# Patient Record
Sex: Female | Born: 1985 | Race: White | Hispanic: Yes | Marital: Married
Health system: Southern US, Community
[De-identification: ages and names within clinical notes are randomized; demographics above are authoritative.]

## PROBLEM LIST (undated history)

## (undated) DIAGNOSIS — N200 Calculus of kidney: Secondary | ICD-10-CM

---

## 2014-01-23 ENCOUNTER — Emergency Department (HOSPITAL_COMMUNITY)
Admission: EM | Admit: 2014-01-23 | Discharge: 2014-01-23 | Disposition: A | Discharge status: Home / Self Care | Payer: Self-pay | Attending: Emergency Medicine | Admitting: Emergency Medicine

## 2014-01-23 ENCOUNTER — Emergency Department (HOSPITAL_COMMUNITY): Payer: Self-pay

## 2014-01-23 ENCOUNTER — Encounter (HOSPITAL_COMMUNITY): Payer: Self-pay | Admitting: Emergency Medicine

## 2014-01-23 DIAGNOSIS — Z3202 Encounter for pregnancy test, result negative: Secondary | ICD-10-CM | POA: Insufficient documentation

## 2014-01-23 DIAGNOSIS — R197 Diarrhea, unspecified: Secondary | ICD-10-CM | POA: Insufficient documentation

## 2014-01-23 DIAGNOSIS — Z87442 Personal history of urinary calculi: Secondary | ICD-10-CM | POA: Insufficient documentation

## 2014-01-23 DIAGNOSIS — R109 Unspecified abdominal pain: Secondary | ICD-10-CM

## 2014-01-23 DIAGNOSIS — R509 Fever, unspecified: Secondary | ICD-10-CM | POA: Insufficient documentation

## 2014-01-23 DIAGNOSIS — R1032 Left lower quadrant pain: Secondary | ICD-10-CM | POA: Insufficient documentation

## 2014-01-23 DIAGNOSIS — R110 Nausea: Secondary | ICD-10-CM | POA: Insufficient documentation

## 2014-01-23 DIAGNOSIS — R55 Syncope and collapse: Secondary | ICD-10-CM | POA: Insufficient documentation

## 2014-01-23 DIAGNOSIS — R1012 Left upper quadrant pain: Secondary | ICD-10-CM | POA: Insufficient documentation

## 2014-01-23 HISTORY — DX: Calculus of kidney: N20.0

## 2014-01-23 LAB — COMPREHENSIVE METABOLIC PANEL
ALT: 55 U/L — ABNORMAL HIGH (ref 0–35)
AST: 54 U/L — ABNORMAL HIGH (ref 0–37)
Albumin: 4.1 g/dL (ref 3.5–5.2)
Alkaline Phosphatase: 76 U/L (ref 39–117)
BUN: 10 mg/dL (ref 6–23)
CALCIUM: 9.3 mg/dL (ref 8.4–10.5)
CO2: 24 mEq/L (ref 19–32)
CREATININE: 0.75 mg/dL (ref 0.50–1.10)
Chloride: 101 mEq/L (ref 96–112)
GFR calc Af Amer: >90 mL/min (ref >90)
GFR calc non Af Amer: >90 mL/min (ref >90)
Glucose, Bld: 107 mg/dL — ABNORMAL HIGH (ref 70–99)
Potassium: 3.9 mEq/L (ref 3.7–5.3)
SODIUM: 138 meq/L (ref 137–147)
TOTAL PROTEIN: 8.2 g/dL (ref 6.0–8.3)
Total Bilirubin: 0.2 mg/dL — ABNORMAL LOW (ref 0.3–1.2)

## 2014-01-23 LAB — CBC WITH DIFFERENTIAL/PLATELET
BASOS ABS: 0 10*3/uL (ref 0.0–0.1)
BASOS PCT: 0 % (ref 0–1)
EOS ABS: 0 10*3/uL (ref 0.0–0.7)
Eosinophils Relative: 0 % (ref 0–5)
HEMATOCRIT: 36.4 % (ref 36.0–46.0)
Hemoglobin: 12.4 g/dL (ref 12.0–15.0)
Lymphocytes Relative: 8 % — ABNORMAL LOW (ref 12–46)
Lymphs Abs: 0.8 10*3/uL (ref 0.7–4.0)
MCH: 30 pg (ref 26.0–34.0)
MCHC: 34.1 g/dL (ref 30.0–36.0)
MCV: 87.9 fL (ref 78.0–100.0)
MONO ABS: 0.5 10*3/uL (ref 0.1–1.0)
Monocytes Relative: 6 % (ref 3–12)
Neutro Abs: 7.9 10*3/uL — ABNORMAL HIGH (ref 1.7–7.7)
Neutrophils Relative %: 86 % — ABNORMAL HIGH (ref 43–77)
PLATELETS: 200 10*3/uL (ref 150–400)
RBC: 4.14 MIL/uL (ref 3.87–5.11)
RDW: 14.6 % (ref 11.5–15.5)
WBC: 9.2 10*3/uL (ref 4.0–10.5)

## 2014-01-23 LAB — URINALYSIS, ROUTINE W REFLEX MICROSCOPIC
Bilirubin Urine: NEGATIVE
Glucose, UA: NEGATIVE mg/dL
Hgb urine dipstick: NEGATIVE
KETONES UR: NEGATIVE mg/dL
LEUKOCYTES UA: NEGATIVE
NITRITE: NEGATIVE
PH: 7 (ref 5.0–8.0)
Protein, ur: NEGATIVE mg/dL
Specific Gravity, Urine: 1.02 (ref 1.005–1.030)
Urobilinogen, UA: 1 mg/dL (ref 0.0–1.0)

## 2014-01-23 LAB — WET PREP, GENITAL
Clue Cells Wet Prep HPF POC: NONE SEEN
Trich, Wet Prep: NONE SEEN
YEAST WET PREP: NONE SEEN

## 2014-01-23 LAB — LIPASE, BLOOD: Lipase: 26 U/L (ref 11–59)

## 2014-01-23 LAB — PREGNANCY, URINE: Preg Test, Ur: NEGATIVE

## 2014-01-23 MED ORDER — ONDANSETRON HCL 4 MG PO TABS: 4.0000 mg | ORAL_TABLET | Freq: Four times a day (QID) | ORAL | Status: AC

## 2014-01-23 MED ORDER — IOHEXOL 300 MG/ML  SOLN
100.0000 mL | Freq: Once | INTRAMUSCULAR | Status: AC | PRN
Start: 2014-01-23 — End: 2014-01-23
  Administered 2014-01-23: 100 mL via INTRAVENOUS

## 2014-01-23 MED ORDER — SODIUM CHLORIDE 0.9 % IV BOLUS (SEPSIS)
1000.0000 mL | Freq: Once | INTRAVENOUS | Status: AC
  Administered 2014-01-23: 1000 mL via INTRAVENOUS

## 2014-01-23 MED ORDER — MORPHINE SULFATE 4 MG/ML IJ SOLN
4.0000 mg | Freq: Once | INTRAMUSCULAR | Status: AC
  Administered 2014-01-23: 4 mg via INTRAVENOUS
  Filled 2014-01-23: qty 1

## 2014-01-23 MED ORDER — IOHEXOL 300 MG/ML  SOLN
50.0000 mL | Freq: Once | INTRAMUSCULAR | Status: AC | PRN
  Administered 2014-01-23: 50 mL via ORAL

## 2014-01-23 MED ORDER — OXYCODONE-ACETAMINOPHEN 5-325 MG PO TABS: 1.0000 | ORAL_TABLET | Freq: Four times a day (QID) | ORAL | Status: AC | PRN

## 2014-01-23 MED ORDER — ONDANSETRON HCL 4 MG/2ML IJ SOLN
4.0000 mg | Freq: Once | INTRAMUSCULAR | Status: AC
  Administered 2014-01-23: 4 mg via INTRAVENOUS
  Filled 2014-01-23: qty 2

## 2014-01-23 NOTE — ED Provider Notes (Signed)
CSN: [id number]     Arrival date & time 01/23/14  0741 History   First MD Initiated Contact with Patient 01/23/14 0801     Chief Complaint  Patient presents with  . Loss of Consciousness  . Headache     (Consider location/radiation/quality/duration/timing/severity/associated sxs/prior Treatment) HPI  Spanish language intepretor used for HPI   Patient presents to the ER with multiple complaints of tactile fever, left flank pain, nausea, and syncope.  She reports that she developed diarrhea with headache for a week that has since resolved. Last night she developed severe nausea and dry heaving with pain last night. This morning when she woke up she had left flank pain that radiates into her back. It is constant and described as a cramping "contractions" pain. She has not had any episodes of vomiting, no RLQ or RUQ abdominal pain. This morning while making breakfast for her husband she reports the feels dizzy and nauseous, while her flank was hurting and passing out. She feels it was very brief and next only remembers her husband talking on the phone. She denies travel outside of the country within the past 6 months and denies being around sick contacts.  At this point she is well looking, awake, alert and unaltered. Currently, no headache or neck pain.   Past Medical History  Diagnosis Date  . Kidney stone    No past surgical history on file. No family history on file. History  Substance Use Topics  . Smoking status: Not on file  . Smokeless tobacco: Not on file  . Alcohol Use: Not on file   OB History   Grav Para Term Preterm Abortions TAB SAB Ect Mult Living                 Review of Systems   Review of Systems  Gen: no weight loss,chills, night sweats  +fevers Eyes: no discharge or drainage, no occular pain or visual changes  Nose: no epistaxis or rhinorrhea  Mouth: no dental pain, no sore throat  Neck: no neck pain  Lungs:No wheezing, coughing or hemoptysis CV: no  chest pain, palpitations, dependent edema or orthopnea  Abd: no abdominal pain,  Vomiting,  + diarrhea and nausea GU: no dysuria or gross hematuria  MSK:  No muscle weakness or pain Neuro: no headache, no focal neurologic deficits, + syncope  Skin: no rash or wounds Psyche: no complaints     Allergies  Review of patient's allergies indicates no known allergies.  Home Medications   Prior to Admission medications   Medication Sig Start Date End Date Taking? Authorizing Provider  Phenylephrine HCl (CONTAC-D PO) Take 1 tablet by mouth as needed (cold sysptoms).   Yes Historical Provider, MD   BP 103/62  Pulse 90  Temp(Src) 99.3 F (37.4 C) (Oral)  Resp 18  SpO2 99% Physical Exam  Nursing note and vitals reviewed. Constitutional: She is oriented to person, place, and time. She appears well-developed and well-nourished. No distress.  HENT:  Head: Normocephalic and atraumatic.  Eyes: Pupils are equal, round, and reactive to light.  Neck: Normal range of motion. Neck supple.  Cardiovascular: Normal rate and regular rhythm.   Pulmonary/Chest: Effort normal.  Abdominal: Soft. Bowel sounds are normal. She exhibits no distension and no fluid wave. There is tenderness in the left upper quadrant and left lower quadrant. There is CVA tenderness (left).    Neurological: She is alert and oriented to person, place, and time. She has normal strength. No cranial nerve  deficit or sensory deficit. GCS eye subscore is 4. GCS verbal subscore is 5. GCS motor subscore is 6.  Skin: Skin is warm and dry.      ED Course  Procedures (including critical care time) Labs Review Labs Reviewed  WET PREP, GENITAL - Abnormal; Notable for the following:    WBC, Wet Prep HPF POC FEW (*)    All other components within normal limits  CBC WITH DIFFERENTIAL - Abnormal; Notable for the following:    Neutrophils Relative % 86 (*)    Neutro Abs 7.9 (*)    Lymphocytes Relative 8 (*)    All other components  within normal limits  COMPREHENSIVE METABOLIC PANEL - Abnormal; Notable for the following:    Glucose, Bld 107 (*)    AST 54 (*)    ALT 55 (*)    Total Bilirubin 0.2 (*)    All other components within normal limits  GC/CHLAMYDIA PROBE AMP  LIPASE, BLOOD  PREGNANCY, URINE  URINALYSIS, ROUTINE W REFLEX MICROSCOPIC    Imaging Review [place] Transvaginal Non-ob  01/23/2014   CLINICAL DATA:  Rule out ovarian torsion  EXAM: TRANSABDOMINAL AND TRANSVAGINAL ULTRASOUND OF PELVIS  DOPPLER ULTRASOUND OF OVARIES  TECHNIQUE: Both transabdominal and transvaginal ultrasound examinations of the pelvis were performed. Transabdominal technique was performed for global imaging of the pelvis including uterus, ovaries, adnexal regions, and pelvic cul-de-sac.  It was necessary to proceed with endovaginal exam following the transabdominal exam to visualize the uterus, endometrium, ovaries and adnexa . Color and duplex Doppler ultrasound was utilized to evaluate blood flow to the ovaries.  COMPARISON:  None.  FINDINGS: Uterus  Measurements: 11.5 x 3.7 x 5.4 cm. No fibroids or other mass visualized.  Endometrium  Thickness: 7 mm.  No focal abnormality visualized.  Right ovary  Measurements: 2.7 x 1.9 x 2.0 cm. Normal appearance/no adnexal mass.  Left ovary  Measurements: 3.0 x 1.5 x 2.4 cm. Normal appearance/no adnexal mass.  Pulsed Doppler evaluation of both ovaries demonstrates normal low-resistance arterial and venous waveforms.  Other findings  Trace free fluid in the pelvis  IMPRESSION: Unremarkable study.   Electronically Signed   By: Charlett NoseKevin  Dover M.D.   On: 01/23/2014 12:29   [place] Pelvis Complete  01/23/2014   CLINICAL DATA:  Rule out ovarian torsion  EXAM: TRANSABDOMINAL AND TRANSVAGINAL ULTRASOUND OF PELVIS  DOPPLER ULTRASOUND OF OVARIES  TECHNIQUE: Both transabdominal and transvaginal ultrasound examinations of the pelvis were performed. Transabdominal technique was performed for global imaging of the pelvis including  uterus, ovaries, adnexal regions, and pelvic cul-de-sac.  It was necessary to proceed with endovaginal exam following the transabdominal exam to visualize the uterus, endometrium, ovaries and adnexa . Color and duplex Doppler ultrasound was utilized to evaluate blood flow to the ovaries.  COMPARISON:  None.  FINDINGS: Uterus  Measurements: 11.5 x 3.7 x 5.4 cm. No fibroids or other mass visualized.  Endometrium  Thickness: 7 mm.  No focal abnormality visualized.  Right ovary  Measurements: 2.7 x 1.9 x 2.0 cm. Normal appearance/no adnexal mass.  Left ovary  Measurements: 3.0 x 1.5 x 2.4 cm. Normal appearance/no adnexal mass.  Pulsed Doppler evaluation of both ovaries demonstrates normal low-resistance arterial and venous waveforms.  Other findings  Trace free fluid in the pelvis  IMPRESSION: Unremarkable study.   Electronically Signed   By: Charlett NoseKevin  Dover M.D.   On: 01/23/2014 12:29   Ct Abdomen Pelvis W Contrast  01/23/2014   CLINICAL DATA:  Left flank pain,  left lower quadrant pain  EXAM: CT ABDOMEN AND PELVIS WITH CONTRAST  TECHNIQUE: Multidetector CT imaging of the abdomen and pelvis was performed using the standard protocol following bolus administration of intravenous contrast.  CONTRAST:  100mL OMNIPAQUE IOHEXOL 300 MG/ML  SOLN  COMPARISON:  None.  FINDINGS: Lung bases are unremarkable. Sagittal images of the spine are unremarkable. Liver, pancreas, spleen and adrenal glands are unremarkable. Kidneys are symmetrical in size and enhancement. No calcified gallstones are noted within gallbladder. No aortic aneurysm.  No small bowel obstruction. Moderate stool noted in right colon and transverse colon. No pericecal inflammation. Terminal ileum is unremarkable. Normal appendix.  The uterus and adnexa are unremarkable. The urinary bladder is unremarkable. No evidence of colitis or diverticulitis.  No hydronephrosis or hydroureter. No pelvic ascites or adenopathy. No inguinal adenopathy. No destructive bony lesions  are noted within pelvis.  IMPRESSION: 1. No acute inflammatory process within abdomen or pelvis. 2. No hydronephrosis or hydroureter. 3. No pericecal inflammation.  Normal appendix. 4. Unremarkable urinary bladder.   Electronically Signed   By: Natasha MeadLiviu  Pop M.D.   On: 01/23/2014 14:35   [place] Art/ven Flow Abd Pelv Doppler  01/23/2014   CLINICAL DATA:  Rule out ovarian torsion  EXAM: TRANSABDOMINAL AND TRANSVAGINAL ULTRASOUND OF PELVIS  DOPPLER ULTRASOUND OF OVARIES  TECHNIQUE: Both transabdominal and transvaginal ultrasound examinations of the pelvis were performed. Transabdominal technique was performed for global imaging of the pelvis including uterus, ovaries, adnexal regions, and pelvic cul-de-sac.  It was necessary to proceed with endovaginal exam following the transabdominal exam to visualize the uterus, endometrium, ovaries and adnexa . Color and duplex Doppler ultrasound was utilized to evaluate blood flow to the ovaries.  COMPARISON:  None.  FINDINGS: Uterus  Measurements: 11.5 x 3.7 x 5.4 cm. No fibroids or other mass visualized.  Endometrium  Thickness: 7 mm.  No focal abnormality visualized.  Right ovary  Measurements: 2.7 x 1.9 x 2.0 cm. Normal appearance/no adnexal mass.  Left ovary  Measurements: 3.0 x 1.5 x 2.4 cm. Normal appearance/no adnexal mass.  Pulsed Doppler evaluation of both ovaries demonstrates normal low-resistance arterial and venous waveforms.  Other findings  Trace free fluid in the pelvis  IMPRESSION: Unremarkable study.   Electronically Signed   By: Charlett NoseKevin  Dover M.D.   On: 01/23/2014 12:29    Alease MedinaRIAS, Jenna Carter VH:[id number] 23-Jan-2014 08:20:01 [facility] ED ROUTINE RECORD Sinus tachycardia Ventricular premature complex Baseline wander in lead(s) V4 V5 4625mm/s 1110mm/mV 150Hz  8.0.1 CID: [id number] Referred by: Unconfirmed Vent. rate 90 BPM PR interval 134 ms QRS duration 89 ms QT/QTc 349/427 ms P-R-T axes 44 55 27 07-Jan-1986 (28 yr) Female  Unknown Room:WLEX4 Loc:501 Technician: [id number] Test ind:   MDM   Final diagnoses:  Abdominal pain    Pt has had a urinalysis, blood work, pelvic exam, pelvic ultrasound and a CT abd/pelv. No abnormalities could be found to explain patients pain. Her pain was controlled with the 2nd dose of pain medication. Her vital signs have remained stable during her stay. Her husband and the patient are relieved that nothing bad was found. At this point, with her pain resolved, vital signs stable and normal images/labs- no other interventions are tests are needed.  27 y.o.Jenna Carter's evaluation in the Emergency Department is complete. It has been determined that no acute conditions requiring further emergency intervention are present at this time. The patient/guardian have been advised of the diagnosis and plan. We have discussed signs and symptoms that warrant  return to the ED, such as changes or worsening in symptoms.  Vital signs are stable at discharge. Filed Vitals:   01/23/14 1145  BP:   Pulse: 90  Temp:   Resp:     Patient/guardian has voiced understanding and agreed to follow-up with the PCP or specialist.      Dorthula Matas, PA-C 01/23/14 1504

## 2014-01-23 NOTE — Discharge Instructions (Signed)
Dolor abdominal en las mujeres  (Abdominal Pain, Women)  El dolor abdominal (en el estómago, la pelvis o el vientre) puede tener muchas causas. Es importante que le informe a su médico:  · La ubicación del dolor.  · ¿Viene y se va, o persiste todo el tiempo?  · ¿Hay situaciones que inician el dolor (comer ciertos alimentos, la actividad física)?  · ¿Tiene otros síntomas asociados al dolor (fiebre, náuseas, vómitos, diarrea)?  Todo es de gran ayuda cuando se trata de hallar la causa del dolor.  CAUSAS  · Estómago: Infecciones por virus o bacterias, o úlcera.  · Intestino: Apendicitis (apéndice inflamado), ileitis regional (enfermedad de Crohn), colitis ulcerosa (colon inflamado), síndrome del colon irritable, diverticulitis (inflamación de los divertículos del colon) o cáncer de estómago oo intestino.  · Enfermedades de la vesícula biliar o cálculos.  · Enfermedades renales, cálculos o infecciones en el riñón.  · Infección o cáncer del páncreas.  · Fibromialgia (trastorno doloroso)  · Enfermedades de los órganos femeninos:  · Uterus: Útero: fibroma (tumor no canceroso) o infección  · Trompas de Falopio: infección o embarazo ectópico  · En los ovarios, quistes o tumores.  · Adherencias pélvicas (tejido cicatrizal).  · Endometriosis (el tejido que cubre el útero se desarrolla en la pelvis y los órganos pélvicos).  · Síndrome de congestión pélvica (los órganos femeninos se llenan de sangre antes del periodo menstrual(  · Dolor durante el periodo menstrual.  · Dolor durante la ovulación (al producir óvulos).  · Dolor al usar el DIU (dispositivo intrauterino para el control de la natalidad)  · Cáncer en los órganos femeninos.  · Dolor funcional (no está originado en una enfermedad, puede mejorar sin tratamiento).  · Dolor de origen psicológico  · Depresión.  DIAGNÓSTICO  Su médico decidirá la gravedad del dolor a través del examen físico  · Análisis de sangre  · Radiografías  · Ecografías  · TC (tomografía computada, tipo  especial de radiografías).  · IMR (resonancia magnética)  · Cultivos, en el caso una infección  · Colon por enema de bario (se inserta una sustancia de contraste en el intestino grueso para mejorar la observación con rayos X.)  · Colonoscopía (observación del intestino con un tubo luminoso).  · Laparoscopía (examen del interior del abdomen con un tubo que tiene una luz).  · Cirugía exploratoria abdominal mayor (se observa el abdomen realizando una gran incisión).  TRATAMIENTO  El tratamiento dependerá de la causa del problema.   · Muchos de estos casos pueden controlarse y tratarse en casa.  · Medicamentos de venta libre indicados por el médico.  · Medicamentos con receta.  · Antibióticos, en caso de infección  · Píldoras anticonceptivas, en el caso de períodos dolorosos o dolor al ovular.  · Tratamiento hormonal, para la endometriosis  · Inyecciones para bloqueo nervioso selectivo.  · Fisioterapia.  · Antidepresivos.  · Consejos por parte de un psícólogo o psiquiatra.  · Cirugía mayor o menor.  INSTRUCCIONES PARA EL CUIDADO DOMICILIARIO  · No tome ni administre laxantes a menos que se lo haya indicado su médico.  · Tome analgésicos de venta libre sólo si se lo ha indicado el profesional que lo asiste. No tome aspirina, ya que puede causar molestias en el estómago o hemorragias.  · Consuma una dieta líquida (caldo o agua) según lo indicado por el médico. Progrese lentamente a una dieta blanda, según la tolerancia, si el dolor se relaciona con el estómago o el intestino.  ·   no se BJ'salivia con los medicamentos o la [place], [place]uede tratar con:  Acupuntura.  Ejercicios de relajacin (yoga,  meditacin).  Terapia grupal.  Psicoterapia. SOLICITE ATENCIN MDICA SI:  Nota que ciertos Pharmacist, communityalimentos le producen dolor de [place]ago.  El tratamiento indicado para Arboriculturistrealizar en el hogar no Marketing executivele alivia el dolor.  Necesita analgsicos ms fuertes.  Quiere que le retiren el DIU.  Si se siente confundido o desfalleciente.  Presenta nuseas o vmitos.  Aparece una erupcin cutnea.  Sufre efectos adversos o una reaccin alrgica debido a los medicamentos que toma. SOLICITE ATENCIN MDICA DE INMEDIATO SI:  El dolor persiste o se agrava.  Tiene fiebre.  Siente el dolor slo en algunos sectores del abdomen. Si se localiza en la zona derecha, posiblemente podra tratarse de apendicitis. En un adulto, si se localiza en la regin inferior izquierda del abdomen, podra tratarse de colitis o diverticulitis.  Hay sangre en las heces (deposiciones de color rojo brillante o negro alquitranado), con o sin vmitos.  Usted presenta sangre en la orina.  Siente escalofros con o sin fiebre.  Se desmaya. ASEGRESE QUE:   Comprende estas instrucciones.  Controlar su enfermedad.  Solicitar ayuda de inmediato si no mejora o si empeora. Document Released: 01/08/2009 Document Revised: 12/15/2011 [facility] Patient Information 2014 [place]ExitCare, [place]. Back Pain, Adult Low back pain is very common. About 1 in 5 people have back pain.The cause of low back pain is rarely dangerous. The pain often gets better over time.About half of people with a sudden onset of back pain feel better in just 2 weeks. About 8 in 10 people feel better by 6 weeks.  CAUSES Some common causes of back pain include:  Strain of the muscles or ligaments supporting the spine.  Wear and tear (degeneration) of the spinal discs.  Arthritis.  Direct injury to the back. DIAGNOSIS Most of the time, the direct cause of low back pain is not known.However, back pain can be treated effectively even when the exact cause of the  pain is unknown.Answering your caregiver's questions about your overall health and symptoms is one of the most accurate ways to make sure the cause of your pain is not dangerous. If your caregiver needs more information, he or she may order lab work or imaging tests (X-rays or MRIs).However, even if imaging tests show changes in your back, this usually does not require surgery. HOME CARE INSTRUCTIONS For many people, back pain returns.Since low back pain is rarely dangerous, it is often a condition that people can learn to [facility] LLCmanageon their own.   Remain active. It is stressful on the back to sit or stand in one place. Do not sit, drive, or stand in one place for more than 30 minutes at a time. Take short walks on level surfaces as soon as pain allows.Try to increase the length of time you walk each day.  Do not stay in bed.Resting more than 1 or 2 days can delay your recovery.  Do not avoid exercise or work.Your body is made to move.It is not dangerous to be active, even though your back may hurt.Your back will likely heal faster if you return to being active before your pain is gone.  Pay attention to your body when you bend and lift. Many people have less discomfortwhen lifting if they bend their knees, keep the load close to their bodies,and avoid twisting. Often, the most comfortable positions are those that put less stress on your recovering back.  Find a comfortable position  to sleep. Use a firm mattress and lie on your side with your knees slightly bent. If you lie on your back, put a pillow under your knees.  Only take over-the-counter or prescription medicines as directed by your caregiver. Over-the-counter medicines to reduce pain and inflammation are often the most helpful.Your caregiver may prescribe muscle relaxant drugs.These medicines help dull your pain so you can more quickly return to your normal activities and healthy exercise.  Put ice on the injured area.  Put ice  in a plastic bag.  Place a towel between your skin and the bag.  Leave the ice on for 15-20 minutes, 03-04 times a day for the first 2 to 3 days. After that, ice and heat may be alternated to reduce pain and spasms.  Ask your caregiver about trying back exercises and gentle massage. This may be of some benefit.  Avoid feeling anxious or stressed.Stress increases muscle tension and can worsen back pain.It is important to recognize when you are anxious or stressed and learn ways to manage it.Exercise is a great option. SEEK MEDICAL CARE IF:  You have pain that is not relieved with rest or medicine.  You have pain that does not improve in 1 week.  You have new symptoms.  You are generally not feeling well. SEEK IMMEDIATE MEDICAL CARE IF:   You have pain that radiates from your back into your legs.  You develop new bowel or bladder control problems.  You have unusual weakness or numbness in your arms or legs.  You develop nausea or vomiting.  You develop abdominal pain.  You feel faint. Document Released: 09/22/2005 Document Revised: 03/23/2012 Document Reviewed: 02/10/2011 [facility]asExitCare Patient Information 2014 [place]Care, [place]LL[place].

## 2014-01-23 NOTE — ED Notes (Signed)
Patient aware of need for urine sample, unable to void at this time. Liter fluid bolus running, will attempt at later time.

## 2014-01-23 NOTE — ED Notes (Signed)
Bed: WA04 Expected date:  Expected time:  Means of arrival:  Comments: EMS- Abdominal pain, non-English speaking

## 2014-01-23 NOTE — ED Notes (Addendum)
Per EMS patient c/o lower left quadrant pain radiating to back, headache, hot to touch, dry heaving all night long, no vomiting, pt took Contact last night for fever. Spanish speaking only. EMS administered 4 mg zofran IV.  **With the interpreter, pt states that she has had diarrhea and a sharp anterior headache for a week, fainted this morning while making breakfast, denies hitting head, has burning LLQ pain radiating to right flank, feels nauseated but has not vomited.

## 2014-01-24 LAB — GC/CHLAMYDIA PROBE AMP
CT PROBE, AMP APTIMA: NEGATIVE
GC Probe RNA: NEGATIVE

## 2014-01-26 NOTE — ED Provider Notes (Signed)
Medical screening examination/treatment/procedure(s) were conducted as a shared visit with non-physician practitioner(s) and myself.  I personally evaluated the patient during the encounter.   EKG Interpretation   Date/Time:  Monday January 23 2014 08:20:01 EDT Ventricular Rate:  90 PR Interval:  134 QRS Duration: 89 QT Interval:  349 QTC Calculation: 427 R Axis:   55 Text Interpretation:  Sinus tachycardia Ventricular premature complex  Baseline wander in lead(s) V4 V5 ED PHYSICIAN INTERPRETATION AVAILABLE IN  [facility] Confirmed by TEST, Record ([id number]) on 01/25/2014 6:52:14 AM     Left flank pain with associated nausea vomiting diarrhea.  No acute abdomen. Labs, urinalysis, CT, ultrasound all negative  Donnetta HutchingBrian Meenakshi Sazama, MD 01/26/14 [phone]

## 2015-04-20 IMAGING — US US ART/VEN ABD/PELV/SCROTUM DOPPLER LTD
1 series · 14 of 25 positions shown · non-contrast
Comparison: None.

CLINICAL DATA: Rule out ovarian torsion

EXAM:
TRANSABDOMINAL AND TRANSVAGINAL ULTRASOUND OF PELVIS
DOPPLER ULTRASOUND OF OVARIES
TECHNIQUE: Both transabdominal and transvaginal ultrasound examinations of the
pelvis were performed. Transabdominal technique was performed for
global imaging of the pelvis including uterus, ovaries, adnexal
regions, and pelvic cul-de-sac.
It was necessary to proceed with endovaginal exam following the
transabdominal exam to visualize the uterus, endometrium, ovaries
and adnexa . Color and duplex Doppler ultrasound was utilized to
evaluate blood flow to the ovaries.

[Series 1: us art/ven abd/pelv/scrotum doppler ltd · 0.22mm/px · 14 of 58 slices shown]
[im 1/58]
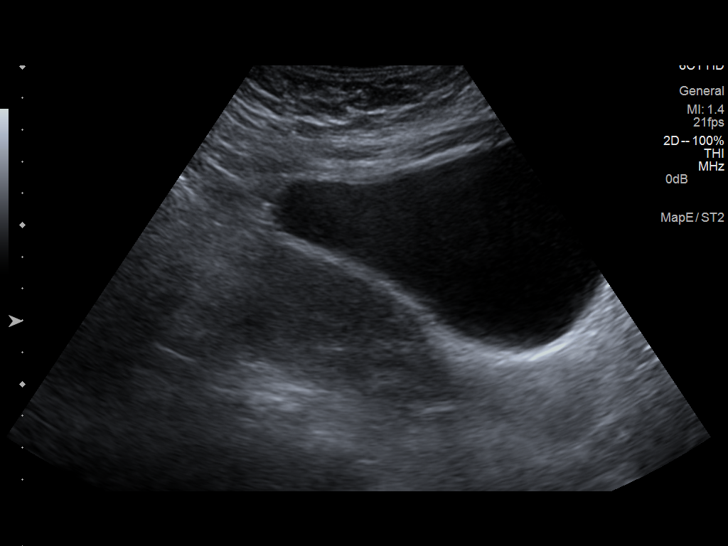
[im 5/58]
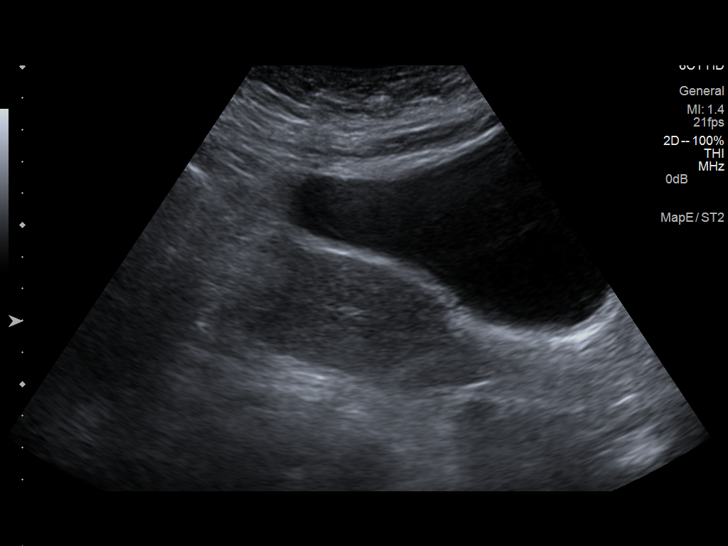
[im 10/58]
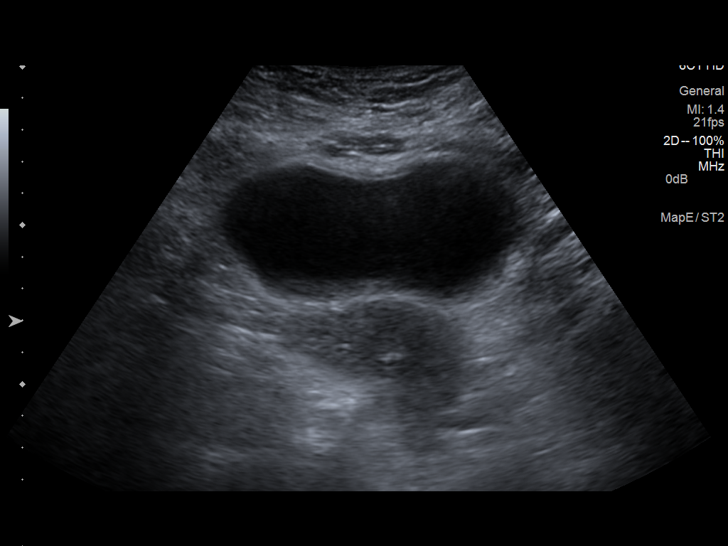
[im 15/58]
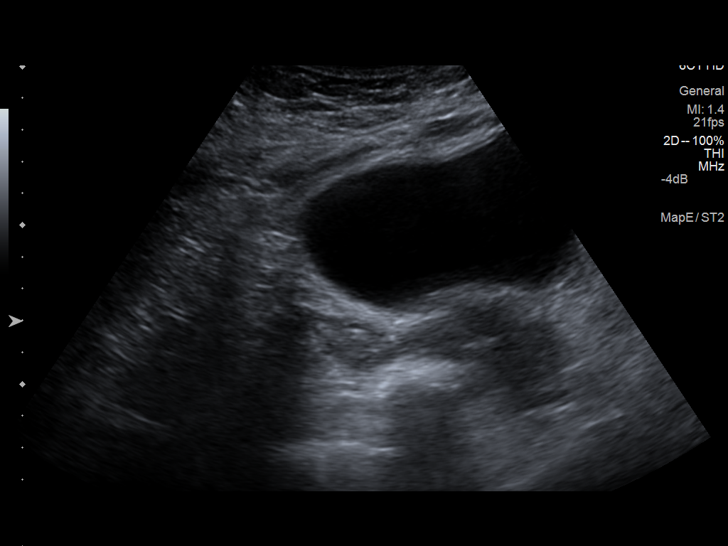
[im 20/58]
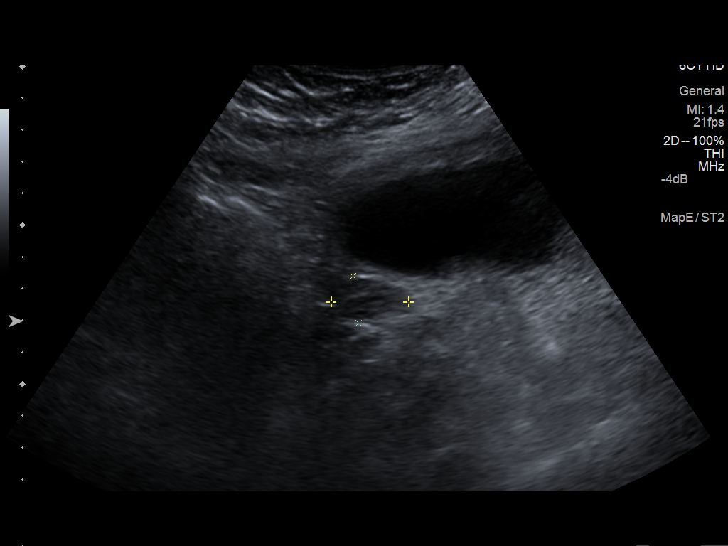
[im 22/58]
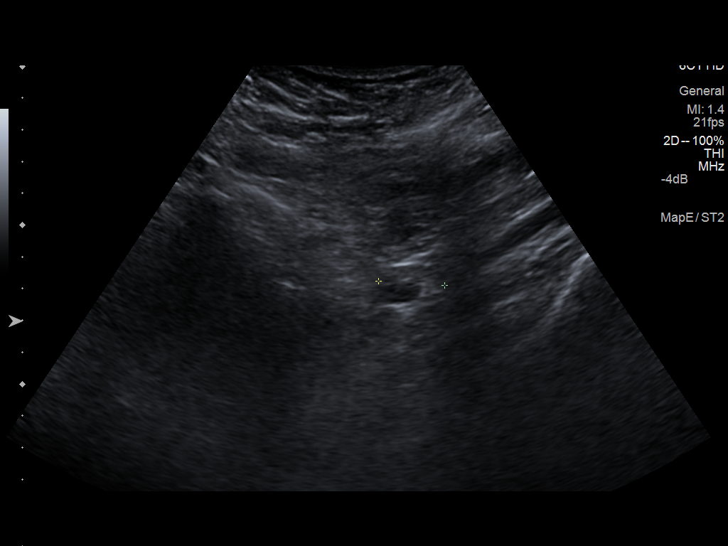
[im 27/58]
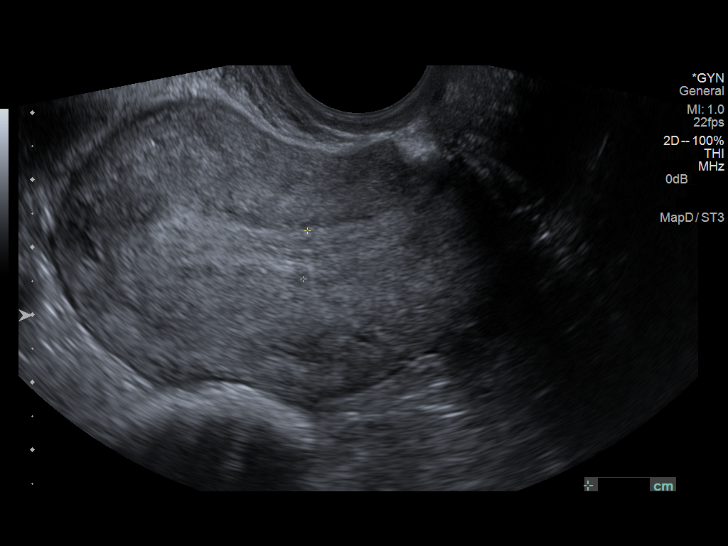
[im 31/58]
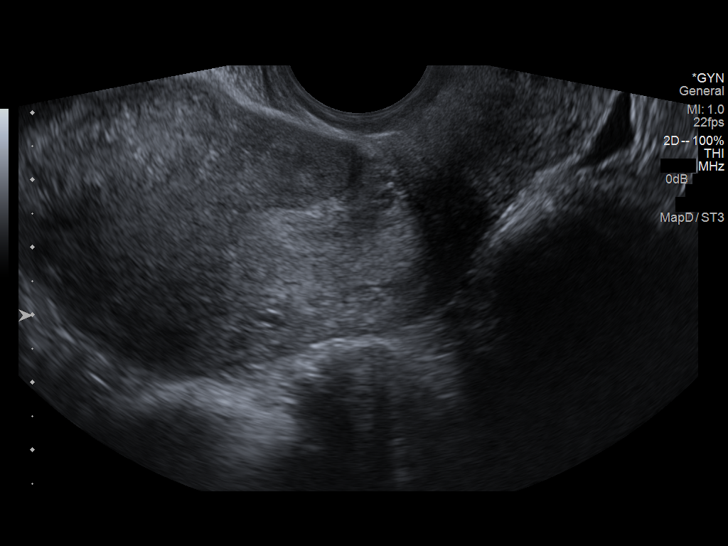
[im 36/58]
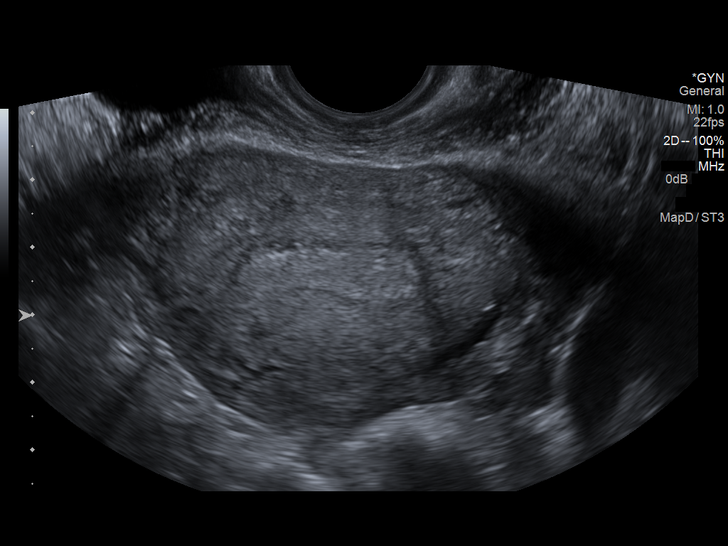
[im 39/58]
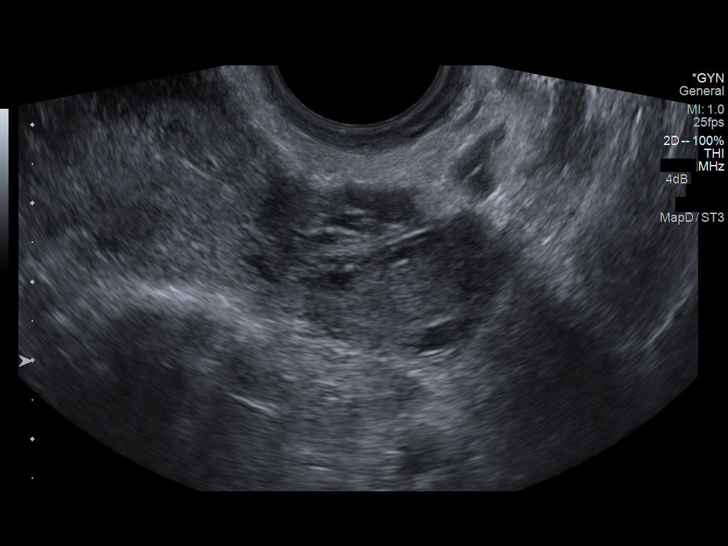
[im 43/58]
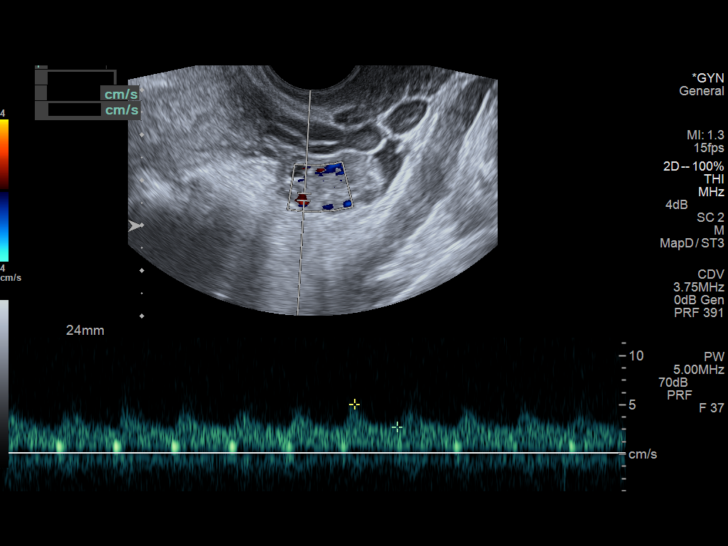
[im 48/58]
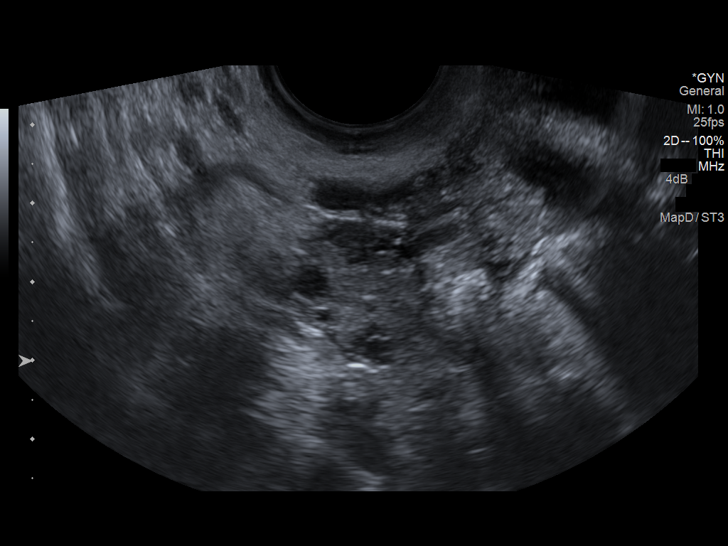
[im 53/58]
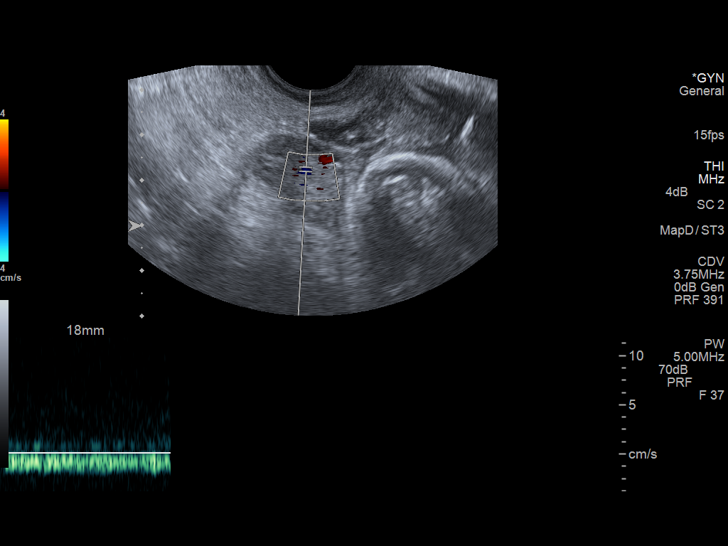
[im 58/58]
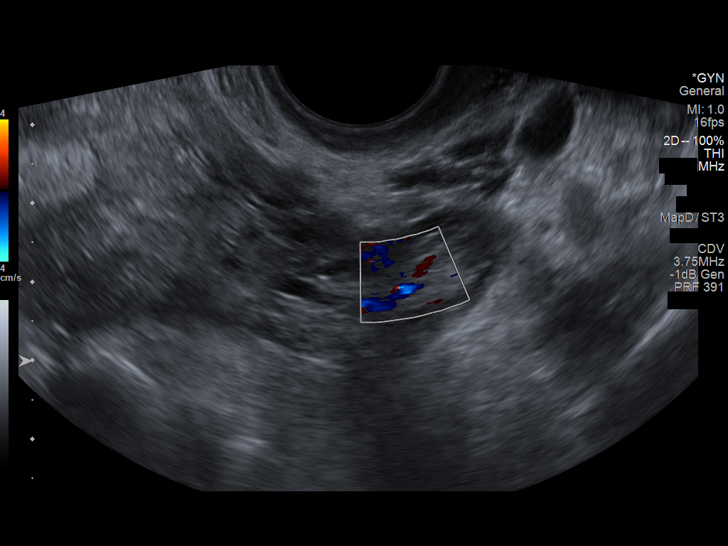

[14 of 25 positions shown; findings below may reference images not displayed]

FINDINGS: Uterus

Measurements: 11.5 x 3.7 x 5.4 cm. No fibroids or other mass
visualized.

Endometrium

Thickness: 7 mm.  No focal abnormality visualized.

Right ovary

Measurements: 2.7 x 1.9 x 2.0 cm. Normal appearance/no adnexal mass.

Left ovary

Measurements: 3.0 x 1.5 x 2.4 cm. Normal appearance/no adnexal mass.

Pulsed Doppler evaluation of both ovaries demonstrates normal
low-resistance arterial and venous waveforms.

Other findings

Trace free fluid in the pelvis
IMPRESSION: Unremarkable study.

## 2015-04-20 IMAGING — CT CT ABD-PELV W/ CM
1 of 2 series · 15 of 32 positions shown, 19 images · IV contrast (OMNIPAQUE 300)
Comparison: None.

CLINICAL DATA: Left flank pain, left lower quadrant pain

EXAM:
CT ABDOMEN AND PELVIS WITH CONTRAST
TECHNIQUE: Multidetector CT imaging of the abdomen and pelvis was performed
using the standard protocol following bolus administration of
intravenous contrast.
CONTRAST:  100mL OMNIPAQUE IOHEXOL 300 MG/ML  SOLN

[Series 2: abd/pel with · axial · 0.65mm/px · z∈[-495,-60]mm · 15 of 95 slices shown, 19 images]
[im 4/95  soft-tissue]
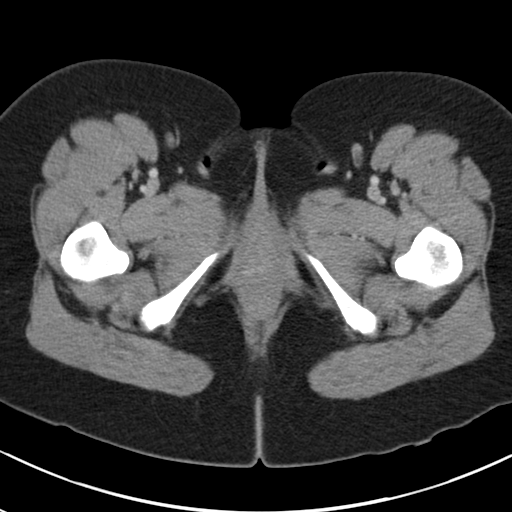
[im 4/95  bone]
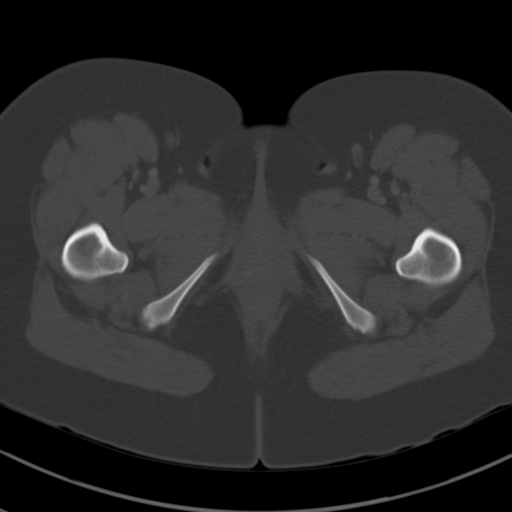
[im 11/95  soft-tissue]
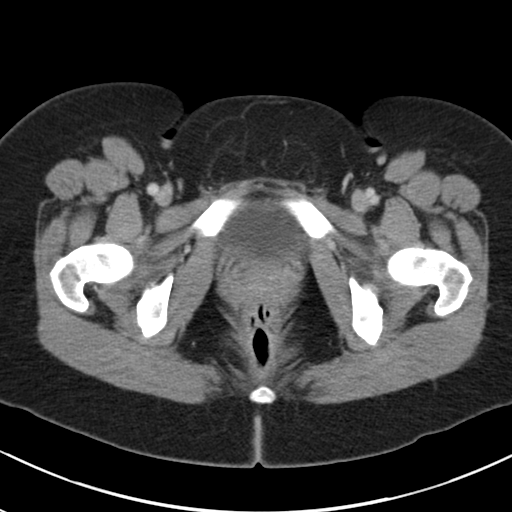
[im 19/95  soft-tissue]
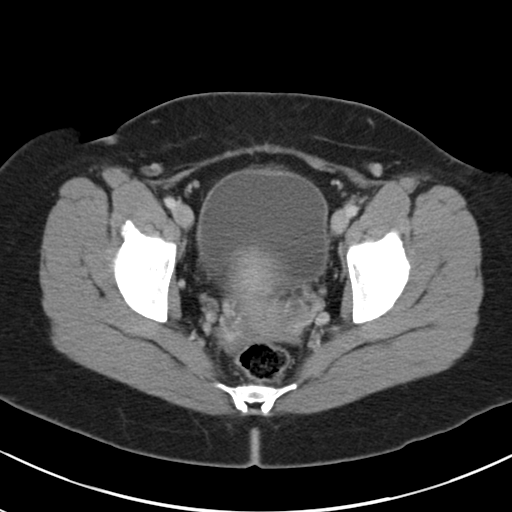
[im 26/95  soft-tissue]
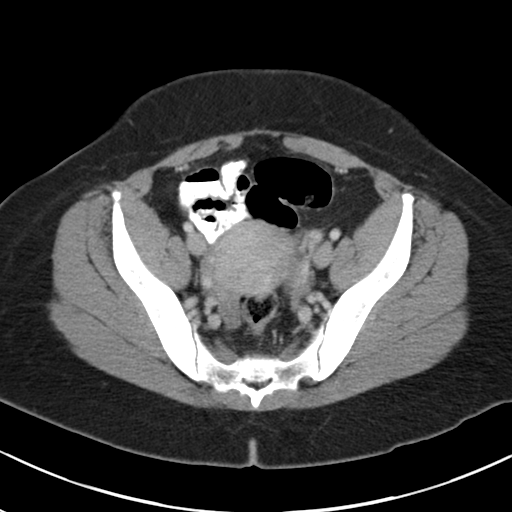
[im 33/95  soft-tissue]
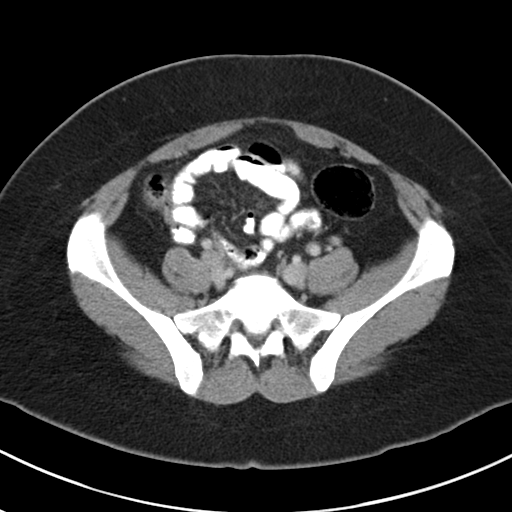
[im 40/95  soft-tissue]
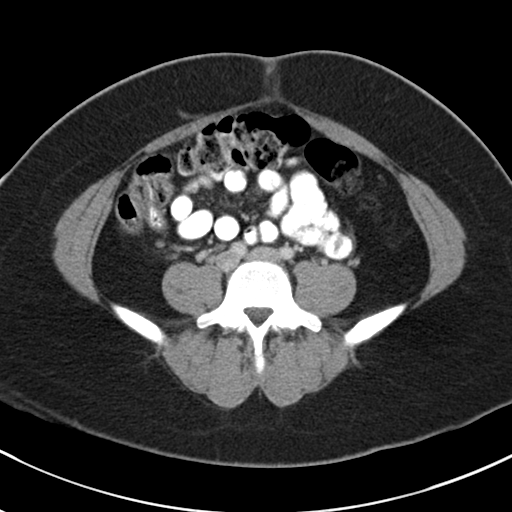
[im 48/95  soft-tissue]
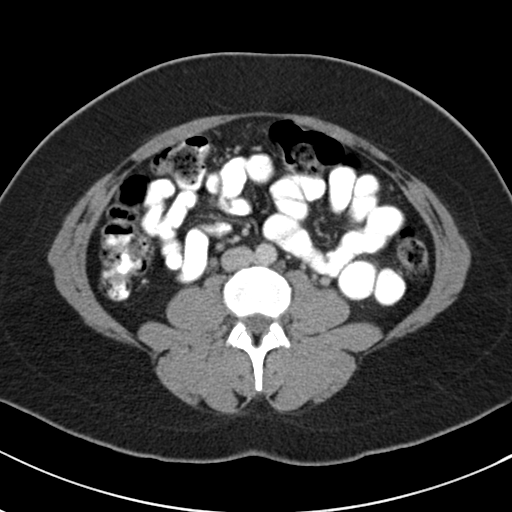
[im 55/95  soft-tissue]
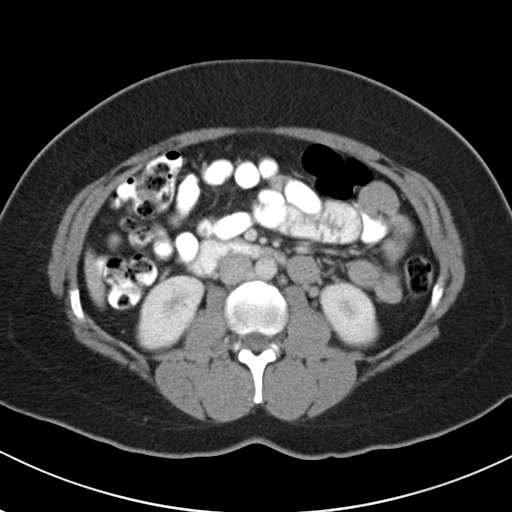
[im 62/95  soft-tissue]
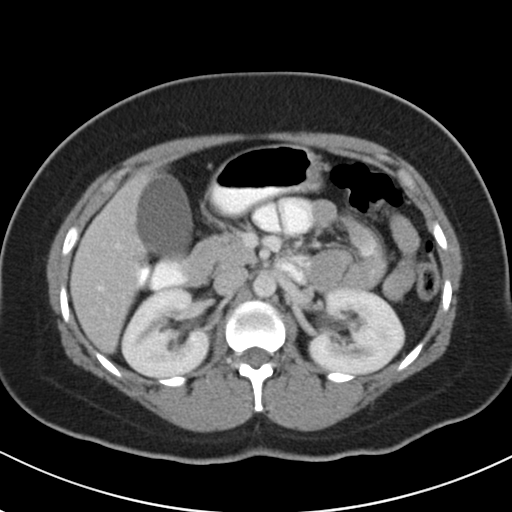
[im 62/95  bone]
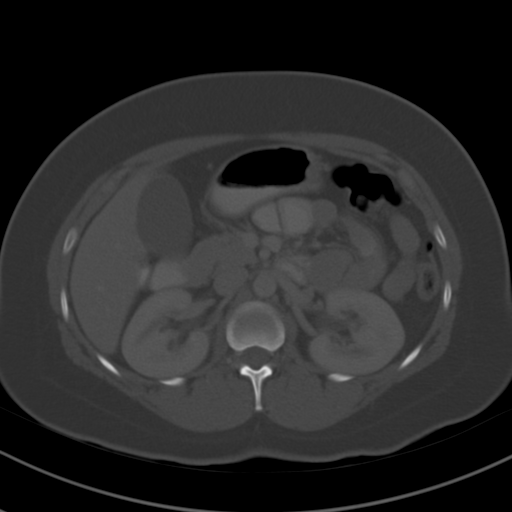
[im 69/95  soft-tissue]
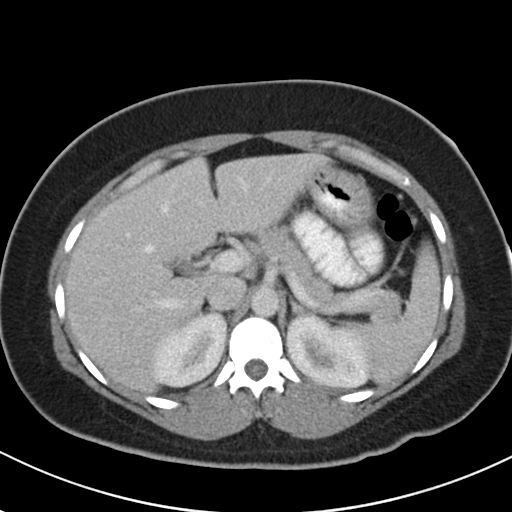
[im 76/95  soft-tissue]
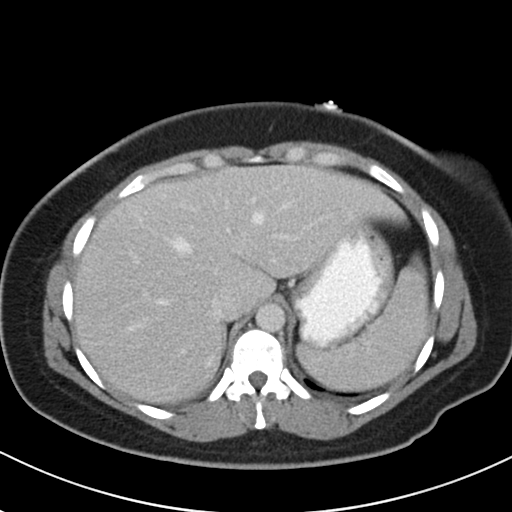
[im 80/95  lung]
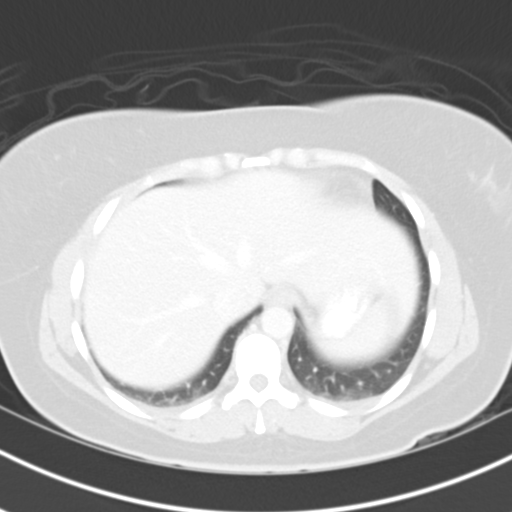
[im 84/95  soft-tissue]
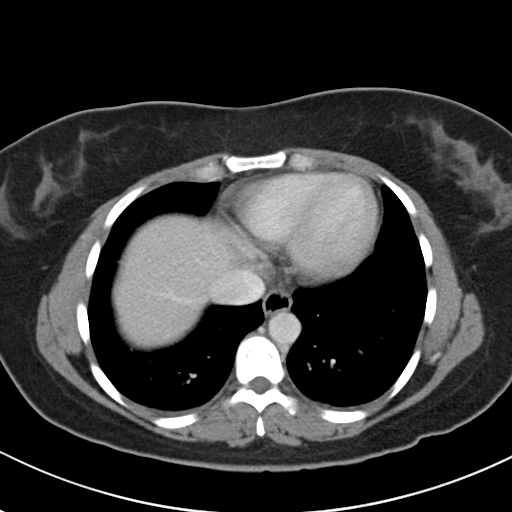
[im 84/95  lung]
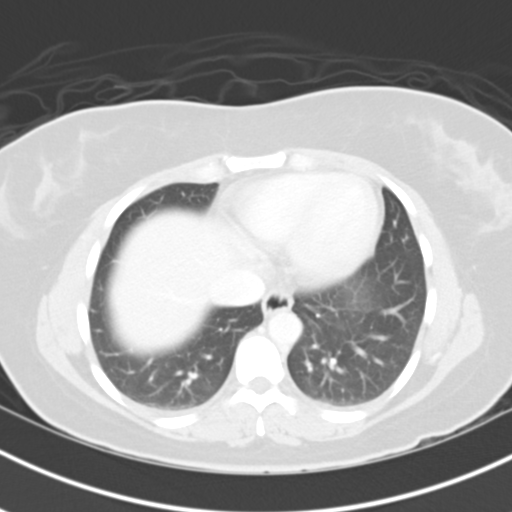
[im 87/95  lung]
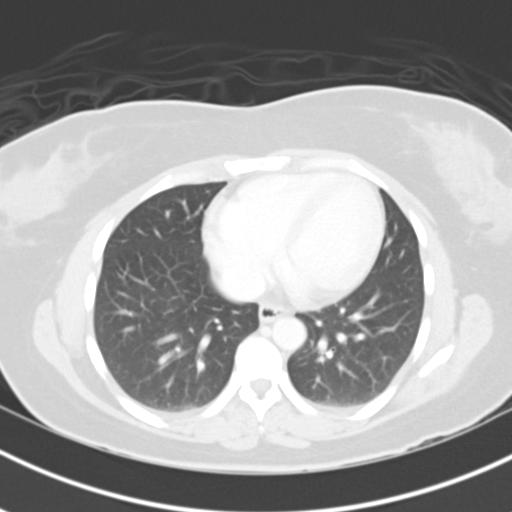
[im 91/95  soft-tissue]
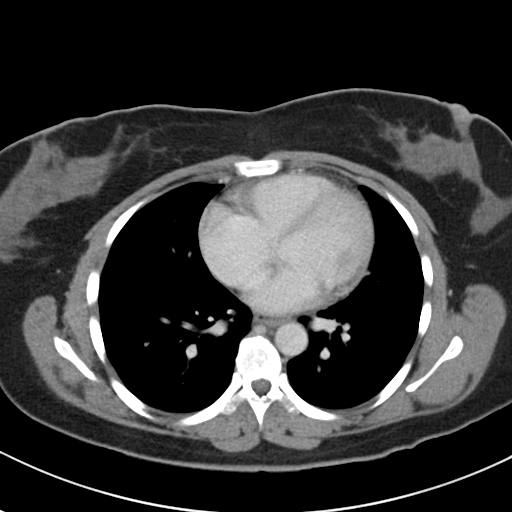
[im 91/95  lung]
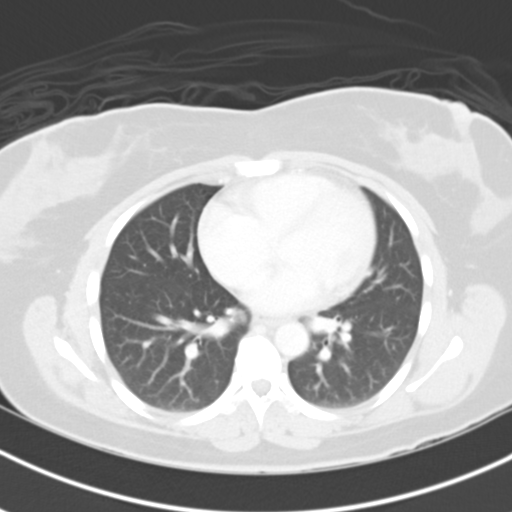

[15 of 32 positions shown; findings below may reference images not displayed]

FINDINGS: Lung bases are unremarkable. Sagittal images of the spine are
unremarkable. Liver, pancreas, spleen and adrenal glands are
unremarkable. Kidneys are symmetrical in size and enhancement. No
calcified gallstones are noted within gallbladder. No aortic
aneurysm.

No small bowel obstruction. Moderate stool noted in right colon and
transverse colon. No pericecal inflammation. Terminal ileum is
unremarkable. Normal appendix.

The uterus and adnexa are unremarkable. The urinary bladder is
unremarkable. No evidence of colitis or diverticulitis.

No hydronephrosis or hydroureter. No pelvic ascites or adenopathy.
No inguinal adenopathy. No destructive bony lesions are noted within
pelvis.
IMPRESSION: 1. No acute inflammatory process within abdomen or pelvis.
2. No hydronephrosis or hydroureter.
3. No pericecal inflammation.  Normal appendix.
4. Unremarkable urinary bladder.
